# Patient Record
Sex: Female | Born: 1965
Health system: Southern US, Community
[De-identification: ages and names within clinical notes are randomized; demographics above are authoritative.]

## PROBLEM LIST (undated history)

## (undated) DIAGNOSIS — M545 Low back pain, unspecified: Secondary | ICD-10-CM

---

## 1997-12-20 ENCOUNTER — Inpatient Hospital Stay (HOSPITAL_COMMUNITY): Admission: AD | Admit: 1997-12-20 | Discharge: 1997-12-20 | Payer: Self-pay | Admitting: Obstetrics & Gynecology

## 1997-12-20 ENCOUNTER — Other Ambulatory Visit: Admission: RE | Admit: 1997-12-20 | Discharge: 1997-12-20 | Payer: Self-pay | Admitting: Obstetrics and Gynecology

## 1998-02-08 ENCOUNTER — Other Ambulatory Visit: Admission: RE | Admit: 1998-02-08 | Discharge: 1998-02-08 | Payer: Self-pay | Admitting: Obstetrics & Gynecology

## 1998-03-19 ENCOUNTER — Inpatient Hospital Stay (HOSPITAL_COMMUNITY): Admission: AD | Admit: 1998-03-19 | Discharge: 1998-03-21 | Payer: Self-pay | Admitting: Obstetrics & Gynecology

## 1999-10-23 ENCOUNTER — Other Ambulatory Visit: Admission: RE | Admit: 1999-10-23 | Discharge: 1999-10-23 | Payer: Self-pay | Admitting: Obstetrics and Gynecology

## 2001-02-18 ENCOUNTER — Other Ambulatory Visit: Admission: RE | Admit: 2001-02-18 | Discharge: 2001-02-18 | Payer: Self-pay | Admitting: Obstetrics and Gynecology

## 2001-04-28 ENCOUNTER — Ambulatory Visit (HOSPITAL_COMMUNITY): Admission: RE | Admit: 2001-04-28 | Discharge: 2001-04-28 | Payer: Self-pay | Admitting: Obstetrics and Gynecology

## 2001-04-28 ENCOUNTER — Encounter: Payer: Self-pay | Admitting: Obstetrics and Gynecology

## 2001-06-28 ENCOUNTER — Inpatient Hospital Stay: Admission: AD | Admit: 2001-06-28 | Discharge: 2001-06-28 | Payer: Self-pay | Admitting: Obstetrics and Gynecology

## 2001-09-28 ENCOUNTER — Ambulatory Visit (HOSPITAL_COMMUNITY): Admission: RE | Admit: 2001-09-28 | Discharge: 2001-09-28 | Payer: Self-pay | Admitting: Obstetrics and Gynecology

## 2001-09-28 ENCOUNTER — Inpatient Hospital Stay (HOSPITAL_COMMUNITY): Admission: AD | Admit: 2001-09-28 | Discharge: 2001-10-01 | Payer: Self-pay | Admitting: Obstetrics and Gynecology

## 2001-09-28 ENCOUNTER — Encounter: Payer: Self-pay | Admitting: Obstetrics and Gynecology

## 2001-11-04 ENCOUNTER — Encounter (HOSPITAL_BASED_OUTPATIENT_CLINIC_OR_DEPARTMENT_OTHER): Payer: Self-pay | Admitting: General Surgery

## 2001-11-04 ENCOUNTER — Ambulatory Visit (HOSPITAL_COMMUNITY): Admission: RE | Admit: 2001-11-04 | Discharge: 2001-11-04 | Payer: Self-pay | Admitting: General Surgery

## 2001-12-07 ENCOUNTER — Encounter: Admission: RE | Admit: 2001-12-07 | Discharge: 2002-01-06 | Payer: Self-pay | Admitting: Obstetrics and Gynecology

## 2001-12-13 ENCOUNTER — Encounter: Payer: Self-pay | Admitting: Gastroenterology

## 2001-12-13 ENCOUNTER — Encounter (HOSPITAL_BASED_OUTPATIENT_CLINIC_OR_DEPARTMENT_OTHER): Payer: Self-pay | Admitting: General Surgery

## 2001-12-13 ENCOUNTER — Encounter (INDEPENDENT_AMBULATORY_CARE_PROVIDER_SITE_OTHER): Payer: Self-pay | Admitting: *Deleted

## 2001-12-13 ENCOUNTER — Ambulatory Visit (HOSPITAL_COMMUNITY): Admission: RE | Admit: 2001-12-13 | Discharge: 2001-12-14 | Payer: Self-pay | Admitting: General Surgery

## 2002-02-08 ENCOUNTER — Other Ambulatory Visit: Admission: RE | Admit: 2002-02-08 | Discharge: 2002-02-08 | Payer: Self-pay | Admitting: Obstetrics and Gynecology

## 2003-02-07 ENCOUNTER — Encounter: Payer: Self-pay | Admitting: Dermatology

## 2003-02-07 ENCOUNTER — Ambulatory Visit (HOSPITAL_COMMUNITY): Admission: RE | Admit: 2003-02-07 | Discharge: 2003-02-07 | Payer: Self-pay | Admitting: Dermatology

## 2003-03-07 ENCOUNTER — Other Ambulatory Visit: Admission: RE | Admit: 2003-03-07 | Discharge: 2003-03-07 | Payer: Self-pay | Admitting: Obstetrics and Gynecology

## 2003-03-21 ENCOUNTER — Encounter: Payer: Self-pay | Admitting: Obstetrics and Gynecology

## 2003-03-21 ENCOUNTER — Ambulatory Visit (HOSPITAL_COMMUNITY): Admission: RE | Admit: 2003-03-21 | Discharge: 2003-03-21 | Payer: Self-pay | Admitting: Obstetrics and Gynecology

## 2005-07-15 ENCOUNTER — Other Ambulatory Visit: Admission: RE | Admit: 2005-07-15 | Discharge: 2005-07-15 | Payer: Self-pay | Admitting: Obstetrics and Gynecology

## 2005-09-09 ENCOUNTER — Ambulatory Visit (HOSPITAL_COMMUNITY): Admission: RE | Admit: 2005-09-09 | Discharge: 2005-09-09 | Payer: Self-pay | Admitting: Obstetrics and Gynecology

## 2007-10-04 ENCOUNTER — Other Ambulatory Visit: Admission: RE | Admit: 2007-10-04 | Discharge: 2007-10-04 | Payer: Self-pay | Admitting: Family Medicine

## 2007-10-06 ENCOUNTER — Encounter: Admission: RE | Admit: 2007-10-06 | Discharge: 2007-10-06 | Payer: Self-pay | Admitting: Family Medicine

## 2008-07-10 ENCOUNTER — Ambulatory Visit: Payer: Self-pay | Admitting: Internal Medicine

## 2008-07-10 DIAGNOSIS — R0609 Other forms of dyspnea: Secondary | ICD-10-CM | POA: Insufficient documentation

## 2008-07-10 DIAGNOSIS — R0989 Other specified symptoms and signs involving the circulatory and respiratory systems: Secondary | ICD-10-CM | POA: Insufficient documentation

## 2010-12-13 NOTE — H&P (Signed)
[facility]  Patient:    Tiffany Jensen, Tiffany Jensen Visit Number: [id number] MRN: [id number]          Service Type: OBS Location: [id number] Attending Physician:  Leonard Schwartz Dictated by:   Marcelle Smiling Clelia Croft, C.N.M. Admit Date:  09/28/2001                           History and Physical  DATE OF BIRTH:                Tiffany Jensen, Tiffany Jensen  HISTORY OF PRESENT ILLNESS:   Tiffany Jensen is a 45 year old married white female gravida 4 para 3-0-0-3 at 30 and two-sevenths weeks who presents from the office secondary to oligohydramnios on ultrasound and variable/questionable late decelerations on nonstress test.  She denies leaking, bleeding, headache, nausea and vomiting, or visual disturbances.  She reports positive fetal movement and occasional uterine contractions.  Her pregnancy has been followed by the [facility] certified nurse midwife service and has been remarkable for: 1. Asthma. 2. History of gallstones with previous pregnancy. 3. Rh negative. 4. Group B strep negative.  PRENATAL LABORATORY DATA:     Her OB labs were collected on February 18, 2001. Blood type O negative, antibody negative.  RPR nonreactive.  Rubella immune. Hepatitis B surface antigen negative.  Pap smear within normal limits.  On June 22, 2001 her one-hour Glucola was 74 and her hemoglobin at that time was 11.8.  Culture of the vaginal tract for group B strep on August 17, 2001 was negative.  HISTORY OF PRESENT PREGNANCY:                    She presented for care at approximately [redacted] weeks gestation.  She declined amniocentesis as well as maternal alpha-fetoprotein. Patient was evaluated for gallstones on April 28, 2001 and was sent to a general surgeon who told her to eat a bland diet and consider surgery after the birth.  She continued to have gallbladder issues one to three times a week.  The rest of her prenatal care was unremarkable.  OBSTETRICAL HISTORY:           She is a gravida 4 para 3-0-0-3.  In April 1997 she vaginally delivered a female infant at 58 and three-sevenths weeks gestation, weighing 7 pounds and 13 ounces, after 12 hours in labor.  She had no anesthesia.  Infants name was Ivin Booty.  In June 1998 she vaginally delivered a female infant at 37 and four-sevenths weeks gestation after three hours in labor.  Infant weighed 6 pounds and 15 ounces.  She had no medications for anesthesia and her name is Shanda Bumps.  In August 1999 she vaginally delivered a female infant at 60 and three-sevenths weeks gestation after 10 hours in labor, weighing 8 pounds and 8 ounces.  She had no medications for anesthesia and his name is Jeri Modena.  ALLERGIES:                    No medication allergies.  MEDICAL HISTORY:              She reports having had the usual childhood illnesses.  She has a history of asthma for which she has been taking Singular approximately every-other day.  She had gallstones with her previous pregnancy.  SURGICAL HISTORY:             Negative.  FAMILY MEDICAL HISTORY:  Mother has a history of phlebitis after delivery.  Paternal grandfather has a history of emphysema.  GENETIC HISTORY:              The patient is age 45 at the time of birth and declines amniocentesis and other genetic testing.  SOCIAL HISTORY:               She is married to [place], who is involved and supportive.  They are both college-educated and deny any alcohol, tobacco, or illicit drug use with the pregnancy.  OBJECTIVE DATA:  VITAL SIGNS:                  Stable.  She is afebrile.  HEENT:                        Grossly within normal limits.  CHEST:                        Clear to auscultation.  HEART:                        Regular to rate and rhythm.  ABDOMEN:                      Gravid in contour with fundal height extending approximately 40 cm above the pubis symphysis.  Electronic fetal monitoring shows uterine contractions every three to  four minutes which are mild.  Fetal heart rate is positive for long-term variability.  There are some accelerations as well as variables and some of those variables are late onset.  PELVIC:                       Cervical exam is 2 cm, 70%, vertex, -2, and posterior.  EXTREMITIES:                  Within normal limits.  ASSESSMENT:                   1. Intrauterine pregnancy at term.                               2. Fetal heart rate changes.                               3. Unfavorable cervix.  PLAN:                         1. Admit to labor and delivery per consult with                                  Dr. Stefano Gaul.                               2. Routine CNM orders.                               3. Options are reviewed with patient regarding  the risks and benefits of induction.  Methods                                  of induction were reviewed including cervical                                  ripening versus Pitocin.  Patient and spouse                                  prefer cervical ripening to be followed by                                  Pitocin if no active labor. Fetal heart rate                                  will be closely observed and the plan will be                                  adjusted accordingly, including Pitocin,                                  artificial rupture of membranes, or C section                                  if needed. Dictated by:   Marcelle Smiling Clelia Croft, C.N.M. Attending Physician:  Leonard Schwartz DD:  09/28/01 TD:  09/29/01 Job: [id number]

## 2010-12-13 NOTE — Op Note (Signed)
[place]  Patient:    Tiffany Jensen, Tiffany Jensen Visit Number: [id number] MRN: [id number]          Service Type: DSU Location: [phone] Attending Physician:  Sonda Primes Dictated by:   Mardene Celeste. Lurene Shadow, M.D. Proc. Date: 12/13/01 Admit Date:  12/13/2001                             Operative Report  PREOPERATIVE DIAGNOSIS:  Chronic calculus cholecystitis.  POSTOPERATIVE DIAGNOSIS:  Chronic calculus cholecystitis and choledocholithiasis.  OPERATION PERFORMED:  Laparoscopic cholecystectomy with intraoperative cholangiogram.  SURGEON:  Luisa Hart L. Lurene Shadow, M.D.  ASSISTANT:  Marnee Spring. Wiliam Ke, M.D.  ANESTHESIA:  General.  INDICATIONS FOR PROCEDURE:  The patient is a 45 year old recently postpartum woman who presents with nausea and vomiting and upper abdominal pain.  On gallbladder ultrasound she is noted to have multiple gallstones within the gallbladder and on further evaluation, there is a shadowing defect in the distal common bile duct; however, the duct is not dilated.  After the discussion of the risks and potential benefits of surgery, the patient gives consent and comes now to surgery for laparoscopic cholecystectomy and intraoperative cholangiogram.  DESCRIPTION OF PROCEDURE:  Following the induction of satisfactory general anesthesia, with the patient positioned supinely the abdomen is prepped and draped routinely, open laparoscopy created at the umbilicus with the insertion of the Hasson type cannula and insufflation of the peritoneal cavity to 14 mHg pressure.  The camera was inserted.  Visual exploration showed the gallbladder to be filled with multiple stones.  There was a dilated cystic duct.  The liver edges were sharp, the liver surface was smooth, the anterior gastric wall and duodenal sweep appeared to be normal.  None of the small or large intestine viewed appeared to be abnormal.  The pelvic organs were not visualized.   Under direct vision, epigastric and lateral ports were placed. The gallbladder was grasped and retracted cephalad.  Dissection carried down in the region of the gallbladder ampulla with isolation of the cystic artery and cystic duct tracing the cystic artery up to its entry in the gallbladder wall and the cystic duct to the gallbladder cystic duct junction.  The cystic arteries were doubly clipped and transected.  The cystic duct was clipped proximally and opened.  I performed cholangiogram by inserting a Reddick catheter into the abdomen and injecting one half strength Hypaque into the cystic duct and into the biliary system.  The resulting cholangiogram showed free flow of contrast into the duodenum and the upper tracts.  However, in the distal common bile duct there was a meniscal effect highly suggestive of common duct stone.  The cholangiocatheter was removed and the cystic duct was triply clamped and transected.  The gallbladder was dissected free from the liver bed using electrocautery and maintaining hemostasis throughout the entire course of dissection.  At the end of this dissection, the right upper quadrant was thoroughly checked for hemostasis and noted to be dry.  Sponge, instrument and sharp counts were verified.  The gallbladder was then retrieved from the umbilical port with the camera in the epigastric port.  This was done without difficulty.  Following second instrument and sharp counts, the wounds were then closed in layers as follows, umbilical wound in two layers with 0 Dexon and 4-0 Dexon.  Epigastric wound and lateral flank wound with 4-0 Dexon sutures.  All wounds were reinforced with Steri-Strips and sterile  dressings applied.  Anesthetic reversed.  Patient removed from the operating room to the recovery room in stable condition with plans for endoscopic retrograde cholangiopancreatography and endoscopic retrograde sphincterotomy with stone extraction  today. Dictated by:   Mardene Celeste. Lurene Shadow, M.D. Attending Physician:  Sonda Primes DD:  12/13/01 TD:  12/14/01 Job: [id number]

## 2011-01-02 ENCOUNTER — Other Ambulatory Visit: Payer: Self-pay | Admitting: Chiropractor

## 2011-01-02 DIAGNOSIS — M545 Low back pain, unspecified: Secondary | ICD-10-CM

## 2011-01-05 ENCOUNTER — Ambulatory Visit
Admission: RE | Admit: 2011-01-05 | Discharge: 2011-01-05 | Disposition: A | Discharge status: Home / Self Care | Payer: 59 | Source: Ambulatory Visit | Attending: Chiropractor | Admitting: Chiropractor

## 2011-01-05 DIAGNOSIS — M545 Low back pain, unspecified: Secondary | ICD-10-CM

## 2011-05-05 ENCOUNTER — Other Ambulatory Visit: Payer: Self-pay | Admitting: Family Medicine

## 2011-05-05 ENCOUNTER — Other Ambulatory Visit (HOSPITAL_COMMUNITY)
Admission: RE | Admit: 2011-05-05 | Discharge: 2011-05-05 | Disposition: A | Discharge status: Home / Self Care | Payer: 59 | Source: Ambulatory Visit | Attending: Family Medicine | Admitting: Family Medicine

## 2011-05-05 DIAGNOSIS — Z0000 Encounter for general adult medical examination without abnormal findings: Secondary | ICD-10-CM | POA: Insufficient documentation

## 2012-02-25 ENCOUNTER — Encounter (HOSPITAL_COMMUNITY): Payer: Self-pay | Admitting: Emergency Medicine

## 2012-02-25 ENCOUNTER — Emergency Department (HOSPITAL_COMMUNITY): Payer: No Typology Code available for payment source

## 2012-02-25 ENCOUNTER — Emergency Department (HOSPITAL_COMMUNITY)
Admission: EM | Admit: 2012-02-25 | Discharge: 2012-02-25 | Disposition: A | Discharge status: Home / Self Care | Payer: No Typology Code available for payment source | Attending: Emergency Medicine | Admitting: Emergency Medicine

## 2012-02-25 DIAGNOSIS — M542 Cervicalgia: Secondary | ICD-10-CM | POA: Insufficient documentation

## 2012-02-25 DIAGNOSIS — M549 Dorsalgia, unspecified: Secondary | ICD-10-CM | POA: Insufficient documentation

## 2012-02-25 DIAGNOSIS — Y92410 Unspecified street and highway as the place of occurrence of the external cause: Secondary | ICD-10-CM | POA: Insufficient documentation

## 2012-02-25 HISTORY — DX: Low back pain, unspecified: M54.50

## 2012-02-25 HISTORY — DX: Low back pain: M54.5

## 2012-02-25 MED ORDER — CYCLOBENZAPRINE HCL 10 MG PO TABS: 10.0000 mg | ORAL_TABLET | Freq: Two times a day (BID) | ORAL | Status: AC | PRN

## 2012-02-25 MED ORDER — OXYCODONE-ACETAMINOPHEN 5-325 MG PO TABS: 1.0000 | ORAL_TABLET | ORAL | Status: AC | PRN

## 2012-02-25 MED ORDER — OXYCODONE-ACETAMINOPHEN 5-325 MG PO TABS
1.0000 | ORAL_TABLET | Freq: Once | ORAL | Status: AC
  Administered 2012-02-25: 1 via ORAL
  Filled 2012-02-25: qty 1
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
## 2013-06-01 ENCOUNTER — Other Ambulatory Visit: Payer: Self-pay | Admitting: Dermatology

## 2014-06-01 ENCOUNTER — Telehealth (HOSPITAL_BASED_OUTPATIENT_CLINIC_OR_DEPARTMENT_OTHER): Payer: Self-pay | Admitting: *Deleted

## 2014-06-01 NOTE — Telephone Encounter (Signed)
Prescription refill for Cyclobenzaprine 10mg  [place] via fax [place] [facility] [place] [phone].  Not authorized and fax returned to fax #[phone]

## 2016-04-18 DIAGNOSIS — H5213 Myopia, bilateral: Secondary | ICD-10-CM | POA: Diagnosis not present

## 2016-04-18 DIAGNOSIS — H52223 Regular astigmatism, bilateral: Secondary | ICD-10-CM | POA: Diagnosis not present

## 2016-04-18 DIAGNOSIS — H524 Presbyopia: Secondary | ICD-10-CM | POA: Diagnosis not present

## 2016-07-11 DIAGNOSIS — Z0000 Encounter for general adult medical examination without abnormal findings: Secondary | ICD-10-CM | POA: Diagnosis not present

## 2017-05-27 DIAGNOSIS — H524 Presbyopia: Secondary | ICD-10-CM | POA: Diagnosis not present

## 2017-05-27 DIAGNOSIS — H5213 Myopia, bilateral: Secondary | ICD-10-CM | POA: Diagnosis not present

## 2017-05-27 DIAGNOSIS — H52223 Regular astigmatism, bilateral: Secondary | ICD-10-CM | POA: Diagnosis not present

## 2017-08-14 ENCOUNTER — Other Ambulatory Visit (HOSPITAL_COMMUNITY)
Admission: RE | Admit: 2017-08-14 | Discharge: 2017-08-14 | Disposition: A | Discharge status: Home / Self Care | Payer: BLUE CROSS/BLUE SHIELD | Source: Ambulatory Visit | Attending: Physician Assistant | Admitting: Physician Assistant

## 2017-08-14 ENCOUNTER — Other Ambulatory Visit: Payer: Self-pay | Admitting: Physician Assistant

## 2017-08-14 DIAGNOSIS — Z0000 Encounter for general adult medical examination without abnormal findings: Secondary | ICD-10-CM | POA: Diagnosis not present

## 2017-08-14 DIAGNOSIS — Z13220 Encounter for screening for lipoid disorders: Secondary | ICD-10-CM | POA: Diagnosis not present

## 2017-08-18 LAB — CYTOLOGY - PAP
DIAGNOSIS: NEGATIVE
HPV: NOT DETECTED

## 2017-08-21 DIAGNOSIS — Z23 Encounter for immunization: Secondary | ICD-10-CM | POA: Diagnosis not present

## 2017-08-26 DIAGNOSIS — J111 Influenza due to unidentified influenza virus with other respiratory manifestations: Secondary | ICD-10-CM | POA: Diagnosis not present

## 2017-08-26 DIAGNOSIS — R509 Fever, unspecified: Secondary | ICD-10-CM | POA: Diagnosis not present

## 2017-11-26 DIAGNOSIS — L821 Other seborrheic keratosis: Secondary | ICD-10-CM | POA: Diagnosis not present

## 2017-11-26 DIAGNOSIS — Z1283 Encounter for screening for malignant neoplasm of skin: Secondary | ICD-10-CM | POA: Diagnosis not present

## 2018-04-26 DIAGNOSIS — Z23 Encounter for immunization: Secondary | ICD-10-CM | POA: Diagnosis not present

## 2018-07-07 DIAGNOSIS — Z23 Encounter for immunization: Secondary | ICD-10-CM | POA: Diagnosis not present

## 2018-07-15 DIAGNOSIS — M4716 Other spondylosis with myelopathy, lumbar region: Secondary | ICD-10-CM | POA: Diagnosis not present

## 2018-07-15 DIAGNOSIS — M545 Low back pain: Secondary | ICD-10-CM | POA: Diagnosis not present

## 2018-07-26 DIAGNOSIS — G894 Chronic pain syndrome: Secondary | ICD-10-CM | POA: Diagnosis not present

## 2018-07-26 DIAGNOSIS — M6281 Muscle weakness (generalized): Secondary | ICD-10-CM | POA: Diagnosis not present

## 2018-07-26 DIAGNOSIS — M545 Low back pain: Secondary | ICD-10-CM | POA: Diagnosis not present

## 2018-08-03 DIAGNOSIS — M6281 Muscle weakness (generalized): Secondary | ICD-10-CM | POA: Diagnosis not present

## 2018-08-03 DIAGNOSIS — M545 Low back pain: Secondary | ICD-10-CM | POA: Diagnosis not present

## 2018-08-03 DIAGNOSIS — G894 Chronic pain syndrome: Secondary | ICD-10-CM | POA: Diagnosis not present

## 2018-08-05 DIAGNOSIS — M545 Low back pain: Secondary | ICD-10-CM | POA: Diagnosis not present

## 2018-08-05 DIAGNOSIS — M6281 Muscle weakness (generalized): Secondary | ICD-10-CM | POA: Diagnosis not present

## 2018-08-05 DIAGNOSIS — G894 Chronic pain syndrome: Secondary | ICD-10-CM | POA: Diagnosis not present

## 2018-08-12 DIAGNOSIS — G894 Chronic pain syndrome: Secondary | ICD-10-CM | POA: Diagnosis not present

## 2018-08-12 DIAGNOSIS — M545 Low back pain: Secondary | ICD-10-CM | POA: Diagnosis not present

## 2018-08-12 DIAGNOSIS — M6281 Muscle weakness (generalized): Secondary | ICD-10-CM | POA: Diagnosis not present

## 2018-08-19 DIAGNOSIS — M545 Low back pain: Secondary | ICD-10-CM | POA: Diagnosis not present

## 2018-08-19 DIAGNOSIS — M6281 Muscle weakness (generalized): Secondary | ICD-10-CM | POA: Diagnosis not present

## 2018-08-19 DIAGNOSIS — G894 Chronic pain syndrome: Secondary | ICD-10-CM | POA: Diagnosis not present

## 2018-08-20 DIAGNOSIS — H5213 Myopia, bilateral: Secondary | ICD-10-CM | POA: Diagnosis not present

## 2018-08-20 DIAGNOSIS — H524 Presbyopia: Secondary | ICD-10-CM | POA: Diagnosis not present

## 2018-08-20 DIAGNOSIS — H52223 Regular astigmatism, bilateral: Secondary | ICD-10-CM | POA: Diagnosis not present

## 2018-09-02 DIAGNOSIS — M6281 Muscle weakness (generalized): Secondary | ICD-10-CM | POA: Diagnosis not present

## 2018-09-02 DIAGNOSIS — G894 Chronic pain syndrome: Secondary | ICD-10-CM | POA: Diagnosis not present

## 2018-09-02 DIAGNOSIS — M545 Low back pain: Secondary | ICD-10-CM | POA: Diagnosis not present

## 2018-09-03 DIAGNOSIS — M62830 Muscle spasm of back: Secondary | ICD-10-CM | POA: Diagnosis not present

## 2018-09-09 DIAGNOSIS — G894 Chronic pain syndrome: Secondary | ICD-10-CM | POA: Diagnosis not present

## 2018-09-09 DIAGNOSIS — M545 Low back pain: Secondary | ICD-10-CM | POA: Diagnosis not present

## 2018-09-09 DIAGNOSIS — M6281 Muscle weakness (generalized): Secondary | ICD-10-CM | POA: Diagnosis not present

## 2018-09-23 DIAGNOSIS — G894 Chronic pain syndrome: Secondary | ICD-10-CM | POA: Diagnosis not present

## 2018-09-23 DIAGNOSIS — M6281 Muscle weakness (generalized): Secondary | ICD-10-CM | POA: Diagnosis not present

## 2018-09-23 DIAGNOSIS — M545 Low back pain: Secondary | ICD-10-CM | POA: Diagnosis not present

## 2018-09-30 DIAGNOSIS — G894 Chronic pain syndrome: Secondary | ICD-10-CM | POA: Diagnosis not present

## 2018-09-30 DIAGNOSIS — M6281 Muscle weakness (generalized): Secondary | ICD-10-CM | POA: Diagnosis not present

## 2018-09-30 DIAGNOSIS — M545 Low back pain: Secondary | ICD-10-CM | POA: Diagnosis not present

## 2018-10-28 DIAGNOSIS — M6281 Muscle weakness (generalized): Secondary | ICD-10-CM | POA: Diagnosis not present

## 2018-10-28 DIAGNOSIS — G894 Chronic pain syndrome: Secondary | ICD-10-CM | POA: Diagnosis not present

## 2018-10-28 DIAGNOSIS — M545 Low back pain: Secondary | ICD-10-CM | POA: Diagnosis not present

## 2019-04-08 DIAGNOSIS — Z7189 Other specified counseling: Secondary | ICD-10-CM | POA: Diagnosis not present

## 2019-04-08 DIAGNOSIS — Z20828 Contact with and (suspected) exposure to other viral communicable diseases: Secondary | ICD-10-CM | POA: Diagnosis not present

## 2019-04-22 DIAGNOSIS — Z20828 Contact with and (suspected) exposure to other viral communicable diseases: Secondary | ICD-10-CM | POA: Diagnosis not present

## 2019-04-22 DIAGNOSIS — Z7189 Other specified counseling: Secondary | ICD-10-CM | POA: Diagnosis not present

## 2019-04-22 DIAGNOSIS — E663 Overweight: Secondary | ICD-10-CM | POA: Diagnosis not present

## 2019-06-16 DIAGNOSIS — R300 Dysuria: Secondary | ICD-10-CM | POA: Diagnosis not present

## 2019-07-11 ENCOUNTER — Other Ambulatory Visit: Payer: Self-pay | Admitting: Physician Assistant

## 2019-07-11 DIAGNOSIS — Z1231 Encounter for screening mammogram for malignant neoplasm of breast: Secondary | ICD-10-CM

## 2019-08-02 ENCOUNTER — Other Ambulatory Visit: Payer: Self-pay

## 2019-08-02 ENCOUNTER — Ambulatory Visit
Admission: RE | Admit: 2019-08-02 | Discharge: 2019-08-02 | Disposition: A | Discharge status: Home / Self Care | Payer: BC Managed Care – PPO | Source: Ambulatory Visit | Attending: Physician Assistant | Admitting: Physician Assistant

## 2019-08-02 DIAGNOSIS — Z1231 Encounter for screening mammogram for malignant neoplasm of breast: Secondary | ICD-10-CM

## 2019-11-24 DIAGNOSIS — Z20828 Contact with and (suspected) exposure to other viral communicable diseases: Secondary | ICD-10-CM | POA: Diagnosis not present

## 2019-12-09 DIAGNOSIS — Z8616 Personal history of COVID-19: Secondary | ICD-10-CM | POA: Diagnosis not present

## 2019-12-09 DIAGNOSIS — U071 COVID-19: Secondary | ICD-10-CM | POA: Diagnosis not present

## 2020-01-17 DIAGNOSIS — Z20822 Contact with and (suspected) exposure to covid-19: Secondary | ICD-10-CM | POA: Diagnosis not present

## 2020-02-04 DIAGNOSIS — Z20828 Contact with and (suspected) exposure to other viral communicable diseases: Secondary | ICD-10-CM | POA: Diagnosis not present

## 2020-03-12 DIAGNOSIS — Z03818 Encounter for observation for suspected exposure to other biological agents ruled out: Secondary | ICD-10-CM | POA: Diagnosis not present

## 2020-04-11 DIAGNOSIS — Z03818 Encounter for observation for suspected exposure to other biological agents ruled out: Secondary | ICD-10-CM | POA: Diagnosis not present

## 2020-04-30 DIAGNOSIS — Z03818 Encounter for observation for suspected exposure to other biological agents ruled out: Secondary | ICD-10-CM | POA: Diagnosis not present

## 2020-05-23 DIAGNOSIS — Z03818 Encounter for observation for suspected exposure to other biological agents ruled out: Secondary | ICD-10-CM | POA: Diagnosis not present

## 2020-07-23 DIAGNOSIS — Z13220 Encounter for screening for lipoid disorders: Secondary | ICD-10-CM | POA: Diagnosis not present

## 2020-07-23 DIAGNOSIS — Z23 Encounter for immunization: Secondary | ICD-10-CM | POA: Diagnosis not present

## 2020-07-23 DIAGNOSIS — Z0000 Encounter for general adult medical examination without abnormal findings: Secondary | ICD-10-CM | POA: Diagnosis not present

## 2020-09-04 DIAGNOSIS — Z23 Encounter for immunization: Secondary | ICD-10-CM | POA: Diagnosis not present

## 2020-10-02 DIAGNOSIS — H2511 Age-related nuclear cataract, right eye: Secondary | ICD-10-CM | POA: Diagnosis not present

## 2020-10-02 DIAGNOSIS — H18413 Arcus senilis, bilateral: Secondary | ICD-10-CM | POA: Diagnosis not present

## 2020-10-02 DIAGNOSIS — H25043 Posterior subcapsular polar age-related cataract, bilateral: Secondary | ICD-10-CM | POA: Diagnosis not present

## 2020-10-02 DIAGNOSIS — H25013 Cortical age-related cataract, bilateral: Secondary | ICD-10-CM | POA: Diagnosis not present

## 2020-10-02 DIAGNOSIS — H2513 Age-related nuclear cataract, bilateral: Secondary | ICD-10-CM | POA: Diagnosis not present

## 2020-10-22 DIAGNOSIS — H2511 Age-related nuclear cataract, right eye: Secondary | ICD-10-CM | POA: Diagnosis not present

## 2020-10-23 DIAGNOSIS — H2512 Age-related nuclear cataract, left eye: Secondary | ICD-10-CM | POA: Diagnosis not present

## 2020-11-12 DIAGNOSIS — H2512 Age-related nuclear cataract, left eye: Secondary | ICD-10-CM | POA: Diagnosis not present

## 2020-11-12 DIAGNOSIS — H25012 Cortical age-related cataract, left eye: Secondary | ICD-10-CM | POA: Diagnosis not present

## 2021-10-21 DIAGNOSIS — Z13220 Encounter for screening for lipoid disorders: Secondary | ICD-10-CM | POA: Diagnosis not present

## 2021-10-21 DIAGNOSIS — Z0000 Encounter for general adult medical examination without abnormal findings: Secondary | ICD-10-CM | POA: Diagnosis not present

## 2021-12-24 ENCOUNTER — Ambulatory Visit
Admission: RE | Admit: 2021-12-24 | Discharge: 2021-12-24 | Disposition: A | Discharge status: Home / Self Care | Payer: BC Managed Care – PPO | Source: Ambulatory Visit | Attending: Family Medicine | Admitting: Family Medicine

## 2021-12-24 ENCOUNTER — Other Ambulatory Visit: Payer: Self-pay | Admitting: Family Medicine

## 2021-12-24 DIAGNOSIS — Z1231 Encounter for screening mammogram for malignant neoplasm of breast: Secondary | ICD-10-CM | POA: Diagnosis not present

## 2022-10-07 DIAGNOSIS — U071 COVID-19: Secondary | ICD-10-CM | POA: Diagnosis not present

## 2022-11-06 DIAGNOSIS — Z0000 Encounter for general adult medical examination without abnormal findings: Secondary | ICD-10-CM | POA: Diagnosis not present

## 2022-11-06 DIAGNOSIS — E785 Hyperlipidemia, unspecified: Secondary | ICD-10-CM | POA: Diagnosis not present

## 2022-11-06 DIAGNOSIS — Z124 Encounter for screening for malignant neoplasm of cervix: Secondary | ICD-10-CM | POA: Diagnosis not present

## 2023-05-25 DIAGNOSIS — E6609 Other obesity due to excess calories: Secondary | ICD-10-CM | POA: Diagnosis not present

## 2023-05-25 DIAGNOSIS — Z6831 Body mass index (BMI) 31.0-31.9, adult: Secondary | ICD-10-CM | POA: Diagnosis not present

## 2023-08-15 IMAGING — MG MM DIGITAL SCREENING BILAT W/ TOMO AND CAD
8 series · 8 of 24 positions shown · non-contrast
Comparison: Previous exam(s).

CLINICAL DATA: Screening.

EXAM:
DIGITAL SCREENING BILATERAL MAMMOGRAM WITH TOMOSYNTHESIS AND CAD
TECHNIQUE: Bilateral screening digital craniocaudal and mediolateral oblique
mammograms were obtained. Bilateral screening digital breast
tomosynthesis was performed. The images were evaluated with
computer-aided detection.

[R CC synth-2D]
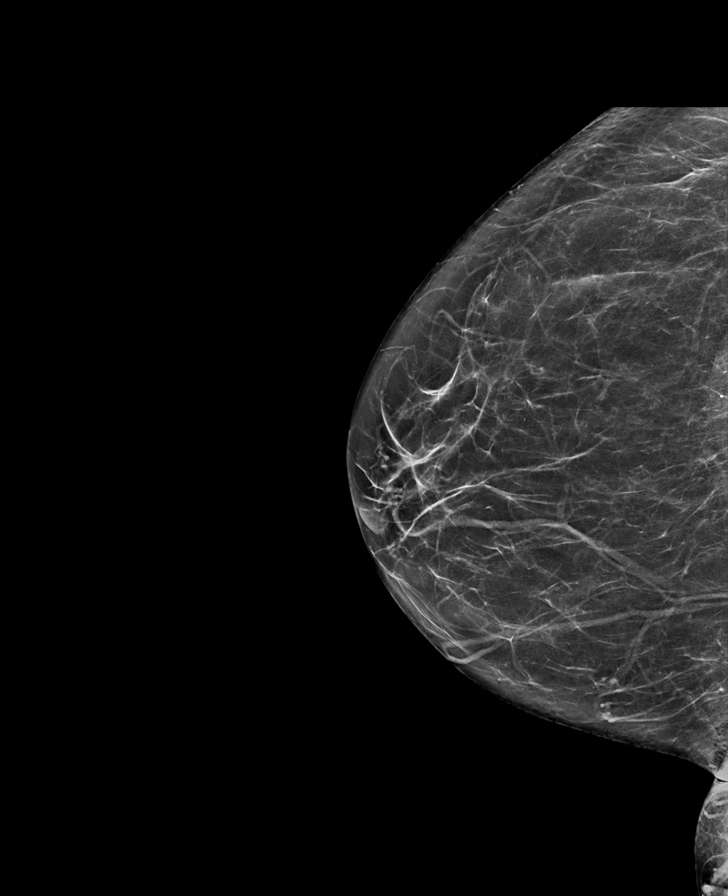

[L CC synth-2D]
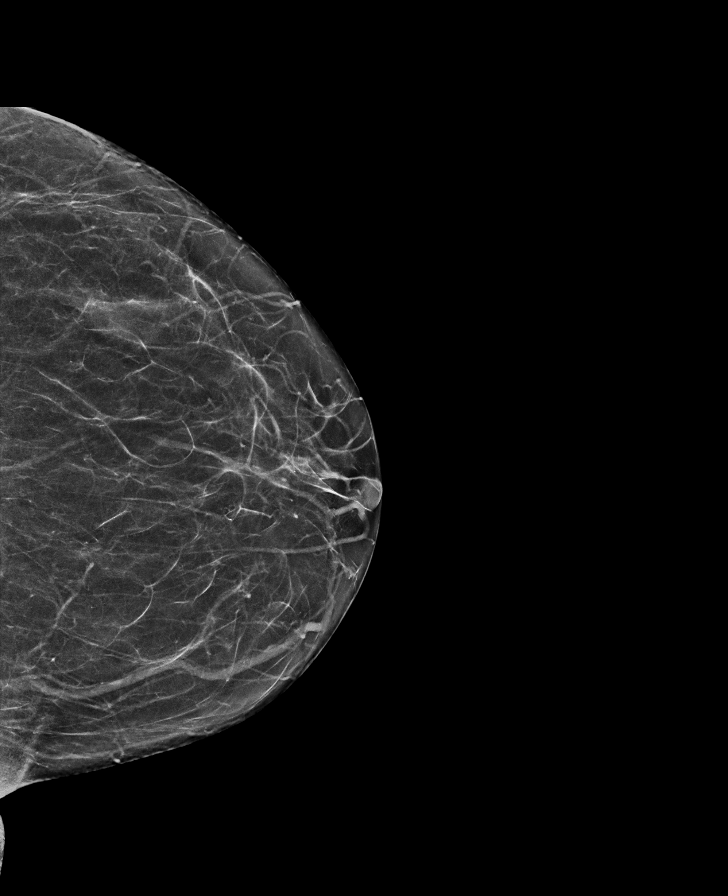

[L MLO synth-2D]
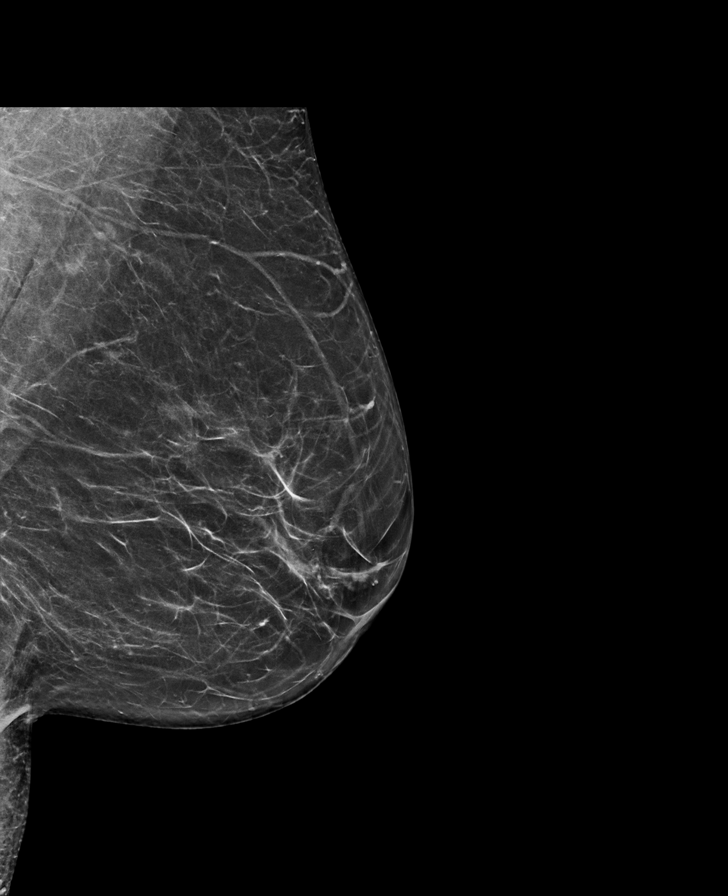

[R MLO synth-2D]
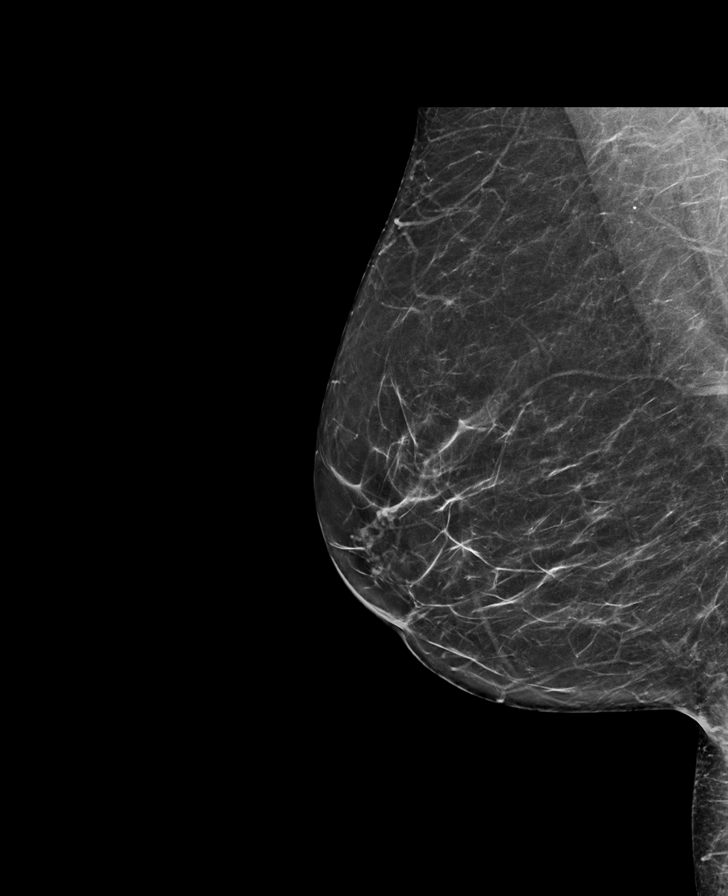

[L CC tomo · tomo slice 31/61.0]
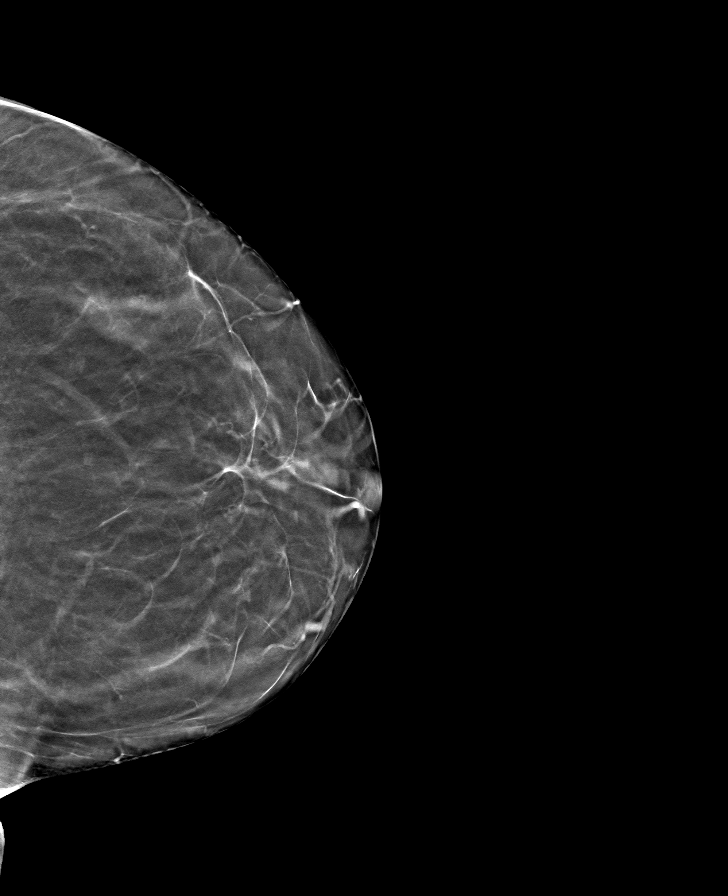

[L MLO tomo · tomo slice 35/70.0]
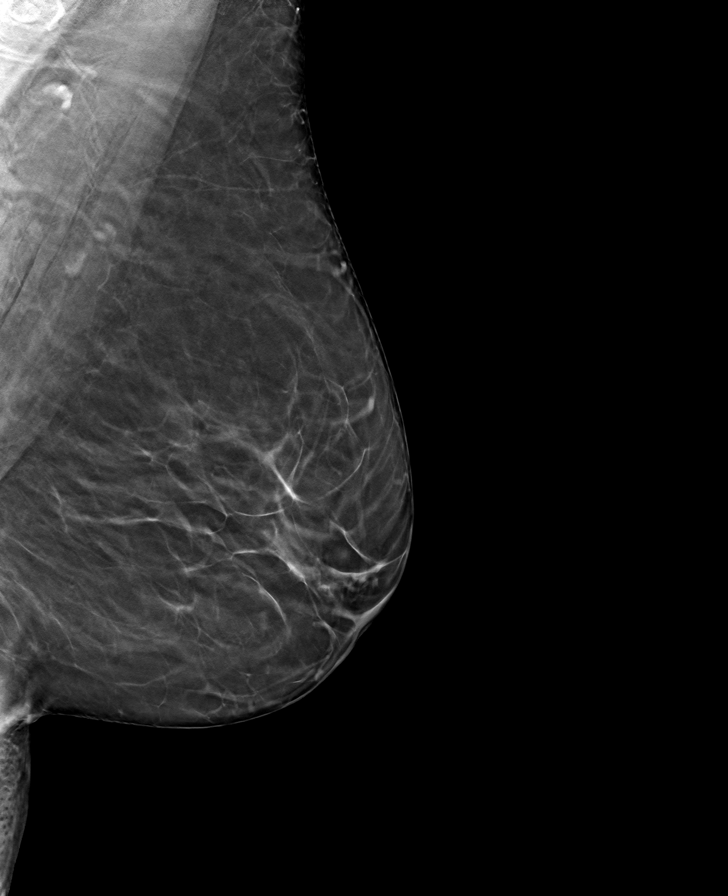

[R MLO tomo · tomo slice 35/70.0]
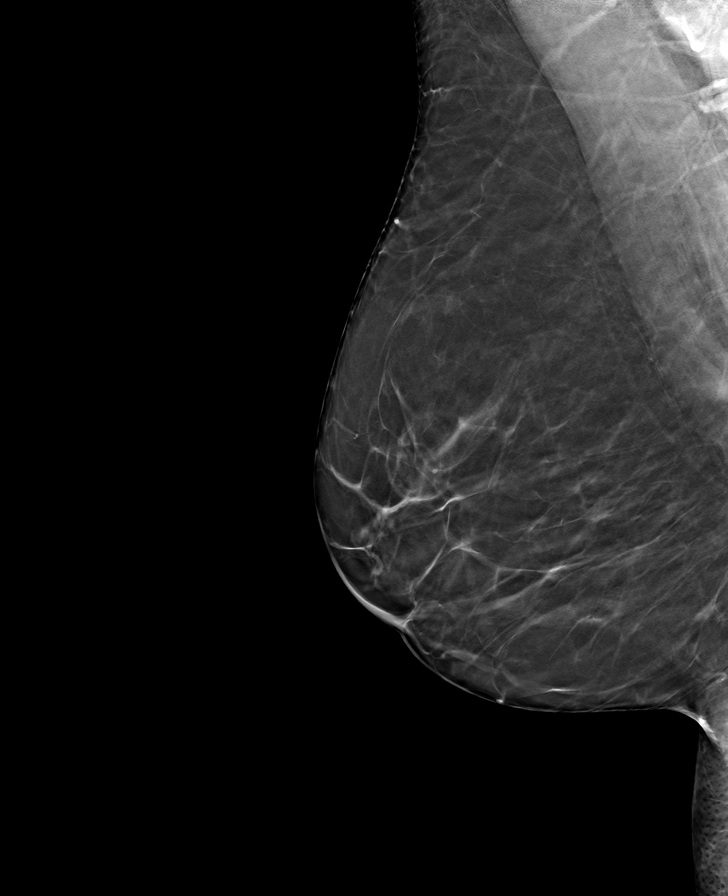

[R CC tomo · tomo slice 35/68.0]
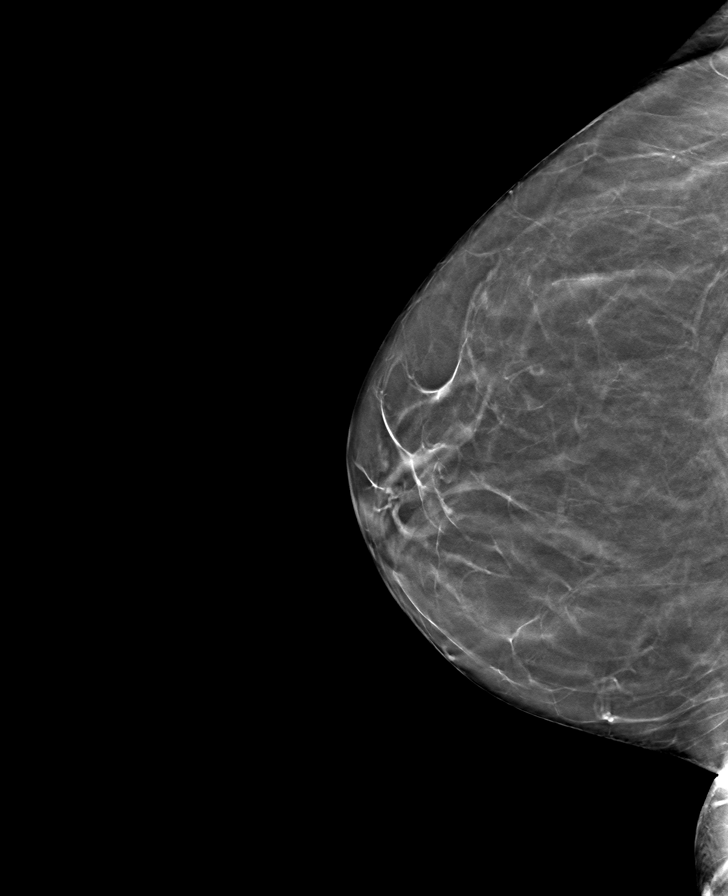

[8 of 24 positions shown; findings below may reference images not displayed]

ACR Breast Density Category b: There are scattered areas of
fibroglandular density.
FINDINGS: There are no findings suspicious for malignancy.
IMPRESSION: No mammographic evidence of malignancy. A result letter of this
screening mammogram will be mailed directly to the patient.

RECOMMENDATION:
Screening mammogram in one year. (Code:51-O-LD2)

BI-RADS CATEGORY  1: Negative.

## 2023-11-23 ENCOUNTER — Other Ambulatory Visit: Payer: Self-pay | Admitting: Family Medicine

## 2023-11-23 DIAGNOSIS — F324 Major depressive disorder, single episode, in partial remission: Secondary | ICD-10-CM | POA: Diagnosis not present

## 2023-11-23 DIAGNOSIS — G4700 Insomnia, unspecified: Secondary | ICD-10-CM | POA: Diagnosis not present

## 2023-11-23 DIAGNOSIS — F419 Anxiety disorder, unspecified: Secondary | ICD-10-CM | POA: Diagnosis not present

## 2023-11-23 DIAGNOSIS — Z0000 Encounter for general adult medical examination without abnormal findings: Secondary | ICD-10-CM | POA: Diagnosis not present

## 2023-11-23 DIAGNOSIS — Z1231 Encounter for screening mammogram for malignant neoplasm of breast: Secondary | ICD-10-CM

## 2023-12-08 ENCOUNTER — Ambulatory Visit
Admission: RE | Admit: 2023-12-08 | Discharge: 2023-12-08 | Disposition: A | Discharge status: Home / Self Care | Source: Ambulatory Visit | Attending: Family Medicine | Admitting: Family Medicine

## 2023-12-08 DIAGNOSIS — Z1231 Encounter for screening mammogram for malignant neoplasm of breast: Secondary | ICD-10-CM | POA: Diagnosis not present

## 2024-01-19 DIAGNOSIS — H93293 Other abnormal auditory perceptions, bilateral: Secondary | ICD-10-CM | POA: Diagnosis not present

## 2024-03-04 DIAGNOSIS — S30861A Insect bite (nonvenomous) of abdominal wall, initial encounter: Secondary | ICD-10-CM | POA: Diagnosis not present

## 2024-03-04 DIAGNOSIS — W57XXXA Bitten or stung by nonvenomous insect and other nonvenomous arthropods, initial encounter: Secondary | ICD-10-CM | POA: Diagnosis not present

## 2024-06-08 DIAGNOSIS — R58 Hemorrhage, not elsewhere classified: Secondary | ICD-10-CM | POA: Diagnosis not present

## 2024-06-08 DIAGNOSIS — N3090 Cystitis, unspecified without hematuria: Secondary | ICD-10-CM | POA: Diagnosis not present

## 2024-06-08 DIAGNOSIS — R300 Dysuria: Secondary | ICD-10-CM | POA: Diagnosis not present

## 2024-07-06 DIAGNOSIS — L570 Actinic keratosis: Secondary | ICD-10-CM | POA: Diagnosis not present

## 2024-07-06 DIAGNOSIS — D2262 Melanocytic nevi of left upper limb, including shoulder: Secondary | ICD-10-CM | POA: Diagnosis not present

## 2024-07-06 DIAGNOSIS — L814 Other melanin hyperpigmentation: Secondary | ICD-10-CM | POA: Diagnosis not present

## 2024-07-06 DIAGNOSIS — D485 Neoplasm of uncertain behavior of skin: Secondary | ICD-10-CM | POA: Diagnosis not present

## 2024-07-06 DIAGNOSIS — D2239 Melanocytic nevi of other parts of face: Secondary | ICD-10-CM | POA: Diagnosis not present

## 2024-07-06 DIAGNOSIS — L578 Other skin changes due to chronic exposure to nonionizing radiation: Secondary | ICD-10-CM | POA: Diagnosis not present

## 2024-07-06 DIAGNOSIS — L821 Other seborrheic keratosis: Secondary | ICD-10-CM | POA: Diagnosis not present
